# Patient Record
Sex: Female | Born: 1965 | Hispanic: Yes | Marital: Married | Smoking: Never smoker
Health system: Southern US, Community
[De-identification: ages and names within clinical notes are randomized; demographics above are authoritative.]

## PROBLEM LIST (undated history)

## (undated) DIAGNOSIS — I10 Essential (primary) hypertension: Secondary | ICD-10-CM

---

## 2006-07-14 ENCOUNTER — Inpatient Hospital Stay: Payer: Self-pay | Admitting: General Surgery

## 2006-08-03 ENCOUNTER — Emergency Department: Payer: Self-pay | Admitting: Emergency Medicine

## 2007-03-21 ENCOUNTER — Ambulatory Visit: Payer: Self-pay | Admitting: Internal Medicine

## 2007-05-02 ENCOUNTER — Ambulatory Visit: Payer: Self-pay | Admitting: Internal Medicine

## 2007-05-18 ENCOUNTER — Ambulatory Visit: Payer: Self-pay

## 2007-05-25 ENCOUNTER — Ambulatory Visit: Payer: Self-pay | Admitting: Gastroenterology

## 2008-01-08 ENCOUNTER — Ambulatory Visit: Payer: Self-pay | Admitting: Internal Medicine

## 2008-01-17 ENCOUNTER — Ambulatory Visit: Payer: Self-pay | Admitting: Otolaryngology

## 2008-01-17 ENCOUNTER — Ambulatory Visit: Payer: Self-pay | Admitting: Internal Medicine

## 2008-01-24 ENCOUNTER — Ambulatory Visit: Payer: Self-pay | Admitting: Otolaryngology

## 2008-03-03 ENCOUNTER — Ambulatory Visit: Payer: Self-pay | Admitting: Gastroenterology

## 2008-04-16 ENCOUNTER — Ambulatory Visit: Payer: Self-pay | Admitting: Surgery

## 2008-04-22 ENCOUNTER — Ambulatory Visit: Payer: Self-pay | Admitting: Surgery

## 2009-01-09 ENCOUNTER — Ambulatory Visit: Payer: Self-pay | Admitting: Gastroenterology

## 2011-02-21 ENCOUNTER — Other Ambulatory Visit: Payer: Self-pay | Admitting: Gastroenterology

## 2011-02-21 LAB — CLOSTRIDIUM DIFFICILE BY PCR

## 2011-02-23 LAB — STOOL CULTURE

## 2011-03-16 ENCOUNTER — Ambulatory Visit: Payer: Self-pay

## 2011-04-12 ENCOUNTER — Ambulatory Visit: Payer: Self-pay

## 2014-05-23 ENCOUNTER — Ambulatory Visit
Admit: 2014-05-23 | Disposition: A | Discharge status: Home / Self Care | Payer: Self-pay | Attending: Cardiology | Admitting: Cardiology

## 2014-05-23 LAB — HCG, QUANTITATIVE, PREGNANCY: Beta Hcg, Quant.: <1 m[IU]/mL

## 2016-03-10 ENCOUNTER — Other Ambulatory Visit: Payer: Self-pay | Admitting: Pediatrics

## 2016-03-10 DIAGNOSIS — Z1239 Encounter for other screening for malignant neoplasm of breast: Secondary | ICD-10-CM

## 2019-10-04 ENCOUNTER — Ambulatory Visit: Payer: BC Managed Care – PPO | Admitting: Physical Therapy

## 2019-10-11 ENCOUNTER — Encounter: Payer: BC Managed Care – PPO | Admitting: Physical Therapy

## 2019-10-18 ENCOUNTER — Ambulatory Visit: Payer: BC Managed Care – PPO | Admitting: Physical Therapy

## 2019-10-22 ENCOUNTER — Ambulatory Visit: Payer: BC Managed Care – PPO | Attending: Gastroenterology | Admitting: Physical Therapy

## 2019-10-24 ENCOUNTER — Encounter: Payer: BC Managed Care – PPO | Admitting: Physical Therapy

## 2019-10-25 ENCOUNTER — Ambulatory Visit: Payer: BC Managed Care – PPO | Admitting: Physical Therapy

## 2019-10-28 ENCOUNTER — Ambulatory Visit: Payer: BC Managed Care – PPO | Admitting: Physical Therapy

## 2019-11-01 ENCOUNTER — Ambulatory Visit: Payer: BC Managed Care – PPO | Admitting: Physical Therapy

## 2019-11-04 ENCOUNTER — Encounter: Payer: BC Managed Care – PPO | Admitting: Physical Therapy

## 2019-11-08 ENCOUNTER — Ambulatory Visit: Payer: BC Managed Care – PPO | Admitting: Physical Therapy

## 2019-11-11 ENCOUNTER — Encounter: Payer: BC Managed Care – PPO | Admitting: Physical Therapy

## 2019-11-18 ENCOUNTER — Encounter: Payer: BC Managed Care – PPO | Admitting: Physical Therapy

## 2019-11-22 ENCOUNTER — Ambulatory Visit: Payer: BC Managed Care – PPO | Admitting: Physical Therapy

## 2019-11-25 ENCOUNTER — Encounter: Payer: BC Managed Care – PPO | Admitting: Physical Therapy

## 2019-11-29 ENCOUNTER — Ambulatory Visit: Payer: BC Managed Care – PPO | Admitting: Physical Therapy

## 2019-12-02 ENCOUNTER — Encounter: Payer: BC Managed Care – PPO | Admitting: Physical Therapy

## 2019-12-06 ENCOUNTER — Ambulatory Visit: Payer: BC Managed Care – PPO | Admitting: Physical Therapy

## 2019-12-09 ENCOUNTER — Encounter: Payer: BC Managed Care – PPO | Admitting: Physical Therapy

## 2019-12-16 ENCOUNTER — Encounter: Payer: BC Managed Care – PPO | Admitting: Physical Therapy

## 2019-12-23 ENCOUNTER — Encounter: Payer: BC Managed Care – PPO | Admitting: Physical Therapy

## 2019-12-30 ENCOUNTER — Encounter: Payer: BC Managed Care – PPO | Admitting: Physical Therapy

## 2021-01-20 ENCOUNTER — Emergency Department: Payer: BC Managed Care – PPO

## 2021-01-20 ENCOUNTER — Other Ambulatory Visit: Payer: Self-pay

## 2021-01-20 ENCOUNTER — Encounter: Payer: Self-pay | Admitting: *Deleted

## 2021-01-20 ENCOUNTER — Emergency Department
Admission: EM | Admit: 2021-01-20 | Discharge: 2021-01-20 | Disposition: A | Discharge status: Home / Self Care | Payer: BC Managed Care – PPO | Attending: Emergency Medicine | Admitting: Emergency Medicine

## 2021-01-20 DIAGNOSIS — L03114 Cellulitis of left upper limb: Secondary | ICD-10-CM | POA: Diagnosis not present

## 2021-01-20 DIAGNOSIS — M79632 Pain in left forearm: Secondary | ICD-10-CM | POA: Diagnosis not present

## 2021-01-20 DIAGNOSIS — M25532 Pain in left wrist: Secondary | ICD-10-CM | POA: Diagnosis present

## 2021-01-20 MED ORDER — TRAMADOL HCL 50 MG PO TABS: 50.0000 mg | ORAL_TABLET | Freq: Four times a day (QID) | ORAL | 0 refills | Status: AC | PRN

## 2021-01-20 MED ORDER — SULFAMETHOXAZOLE-TRIMETHOPRIM 800-160 MG PO TABS: 1.0000 | ORAL_TABLET | Freq: Two times a day (BID) | ORAL | 0 refills | Status: AC

## 2021-01-20 MED ORDER — TRAMADOL HCL 50 MG PO TABS
50.0000 mg | ORAL_TABLET | Freq: Once | ORAL | Status: AC
  Administered 2021-01-20: 23:00:00 50 mg via ORAL
  Filled 2021-01-20: qty 1

## 2021-01-20 MED ORDER — SULFAMETHOXAZOLE-TRIMETHOPRIM 800-160 MG PO TABS
1.0000 | ORAL_TABLET | Freq: Once | ORAL | Status: AC
  Administered 2021-01-20: 23:00:00 1 via ORAL
  Filled 2021-01-20: qty 1

## 2021-01-20 NOTE — ED Provider Notes (Signed)
[facility] Emergency Department Provider Note ____________________________________________  Time seen: Approximately 8:37 PM  I have reviewed the triage vital signs and the nursing notes.   HISTORY  Chief Complaint Wrist Pain    HPI Michelle Valdez is a 55 y.o. female who presents to the emergency department for evaluation and treatment of wrist pain. No injury. She had some swelling and pain last week that was responsive to ibuprofen. Symptoms returned 3 days ago and has not.   No past medical history on file.  There are no problems to display for this patient.   Prior to Admission medications   Medication Sig Start Date End Date Taking? Authorizing Provider  sulfamethoxazole-trimethoprim (BACTRIM DS) 800-160 MG tablet Take 1 tablet by mouth 2 (two) times daily. 01/20/21  Yes Robet Crutchfield B, FNP  traMADol (ULTRAM) 50 MG tablet Take 1 tablet (50 mg total) by mouth every 6 (six) hours as needed. 01/20/21  Yes Versie Soave, Kasandra Knudsen, FNP    Allergies Patient has no known allergies.  No family history on file.  Social History Social History   Tobacco Use   Smoking status: Never   Smokeless tobacco: Never  Substance Use Topics   Alcohol use: Not Currently    Review of Systems Constitutional: Negative for fever. Cardiovascular: Negative for chest pain. Respiratory: Negative for shortness of breath. Musculoskeletal: Positive for left wrist pain Skin: Positive for erythema and edema of the left wrist  Neurological: Negative for decrease in sensation  ____________________________________________   PHYSICAL EXAM:  VITAL SIGNS: ED Triage Vitals  Enc Vitals Group     BP 01/20/21 1916 (!) 183/85     Pulse Rate 01/20/21 1916 87     Resp 01/20/21 1916 18     Temp 01/20/21 1916 98.4 F (36.9 C)     Temp Source 01/20/21 1916 Oral     SpO2 01/20/21 1916 97 %     Weight 01/20/21 1914 165 lb (74.8 kg)     Height 01/20/21 1914 5' (1.524 m)     Head  Circumference --      Peak Flow --      Pain Score 01/20/21 1913 10     Pain Loc --      Pain Edu? --      Excl. in GC? --     Constitutional: Alert and oriented. Well appearing and in no acute distress. Eyes: Conjunctivae are clear without discharge or drainage Head: Atraumatic Neck: Supple Respiratory: No cough. Respirations are even and unlabored. Musculoskeletal: Edematous, slightly erythematous area overlying left wrist. Neurologic: Motor and sensory function intact  Skin: mild erythema of dorsal aspect of left wrist without lymphangitis or focal fluid collection. Psychiatric: Affect and behavior are appropriate.  ____________________________________________   LABS (all labs ordered are listed, but only abnormal results are displayed)  Labs Reviewed - No data to display ____________________________________________  RADIOLOGY  X-ray of the left wrist is negative for acute bony abnormality.  Ultrasound image of the left upper extremity is negative for DVT.  I, Kem Boroughs, personally viewed and evaluated these images (plain radiographs) as part of my medical decision making, as well as reviewing the written report by the radiologist.  DG Wrist Complete Left  Result Date: 01/20/2021 CLINICAL DATA:  Pain and swelling EXAM: LEFT WRIST - COMPLETE 3+ VIEW COMPARISON:  None. FINDINGS: No fracture or malalignment. Positive for soft tissue swelling. No periostitis or bony destructive change. Mild degenerative change at the distal radioulnar joint. IMPRESSION: Soft tissue  swelling without acute osseous abnormality Electronically Signed   By: Jasmine Pang M.D.   On: 01/20/2021 21:00   US Venous Img Upper Uni Left  Result Date: 01/20/2021 CLINICAL DATA:  Pain and swelling to the dorsal wrist and forearm. EXAM: Left UPPER EXTREMITY VENOUS DOPPLER ULTRASOUND TECHNIQUE: Gray-scale sonography with graded compression, as well as color Doppler and duplex ultrasound were performed to  evaluate the upper extremity deep venous system from the level of the subclavian vein and including the jugular, axillary, basilic, radial, ulnar and upper cephalic vein. Spectral Doppler was utilized to evaluate flow at rest and with distal augmentation maneuvers. COMPARISON:  None. FINDINGS: Contralateral Subclavian Vein:  Not imaged. Internal Jugular Vein: No evidence of thrombus. Normal compressibility, respiratory phasicity and response to augmentation. Subclavian Vein: No evidence of thrombus. Normal compressibility, respiratory phasicity and response to augmentation. Axillary Vein: No evidence of thrombus. Normal compressibility, respiratory phasicity and response to augmentation. Cephalic Vein: No evidence of thrombus. Normal compressibility, respiratory phasicity and response to augmentation. Basilic Vein: No evidence of thrombus. Normal compressibility, respiratory phasicity and response to augmentation. Brachial Veins: No evidence of thrombus. Normal compressibility, respiratory phasicity and response to augmentation. Radial Veins: No evidence of thrombus. Normal compressibility, respiratory phasicity and response to augmentation. Ulnar Veins: No evidence of thrombus. Normal compressibility, respiratory phasicity and response to augmentation. Venous Reflux:  None visualized. Other Findings: Images obtained in the area of swelling and pain in the dorsal wrist area demonstrate no loculated collections or focal abnormalities. IMPRESSION: No evidence of DVT within the left upper extremity. Electronically Signed   By: Burman Nieves M.D.   On: 01/20/2021 21:53   ____________________________________________   PROCEDURES  Procedures  ____________________________________________   INITIAL IMPRESSION / ASSESSMENT AND PLAN / ED COURSE  Michelle Valdez is a 55 y.o. who presents to the emergency department for treatment and evaluation of left non-traumatic left wrist pain. See HPI  Plan will be to get  x-ray and if negative, order [place]. Patient and family aware and agree to the plan.  X-ray is negative for acute bony abnormality. [place] ordered.  X-ray and ultrasound are negative.  Because she does have some mild swelling and erythema along with pain, plan will be to treat her for an early cellulitis.  BUN and creatinine from labs drawn earlier in December were normal.  She was prescribed Bactrim.  She was encouraged to follow-up with her primary care provider if symptoms or not improving over the next 2 to 3 days.  If she is unable to be evaluated at primary care, she is to return to the emergency department.   Medications  sulfamethoxazole-trimethoprim (BACTRIM DS) 800-160 MG per tablet 1 tablet (has no administration in time range)  traMADol (ULTRAM) tablet 50 mg (has no administration in time range)    Pertinent labs & imaging results that were available during my care of the patient were reviewed by me and considered in my medical decision making (see chart for details).   _________________________________________   FINAL CLINICAL IMPRESSION(S) / ED DIAGNOSES  Final diagnoses:  Cellulitis of left upper extremity    ED Discharge Orders          Ordered    sulfamethoxazole-trimethoprim (BACTRIM DS) 800-160 MG tablet  2 times daily        01/20/21 2220    traMADol (ULTRAM) 50 MG tablet  Every 6 hours PRN        01/20/21 2220  If controlled substance prescribed during this visit, 12 month history viewed on the [facility] prior to issuing an initial prescription for Schedule II or III opiod.    Chinita Pester, FNP 01/20/21 2224    Merwyn Katos, MD 01/21/21 1515

## 2021-01-20 NOTE — ED Triage Notes (Signed)
Pt has pain in left wrist.  No known injury.  Pt wearing a brace.  Swelling noted.  Pt alert.

## 2023-07-19 IMAGING — US US EXTREM  UP VENOUS*L*
1 series · 13 of 24 positions shown · non-contrast
Comparison: None.

CLINICAL DATA: Pain and swelling to the dorsal wrist and forearm.



[Series 1: us venous img upper uni left (dvt) · portal-venous · 13 of 39 slices shown]
[im 1/39]
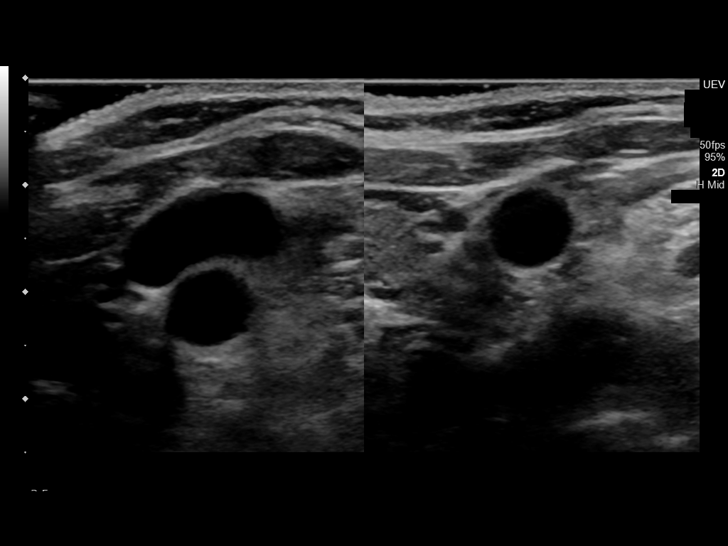
[im 4/39]
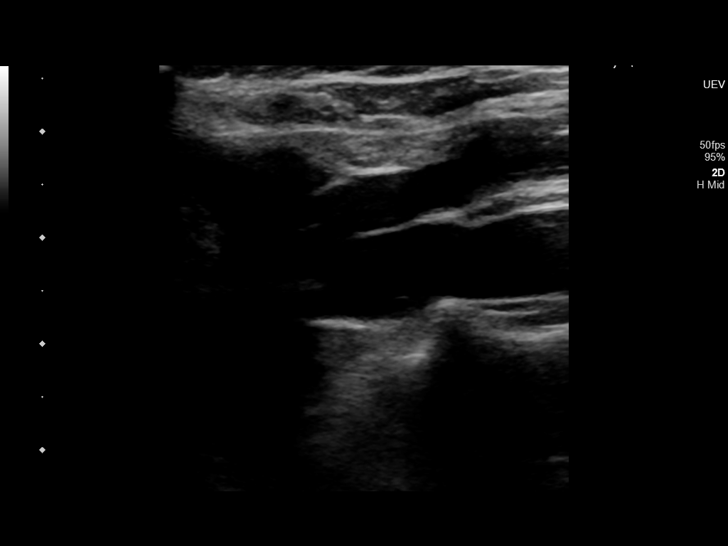
[im 7/39]
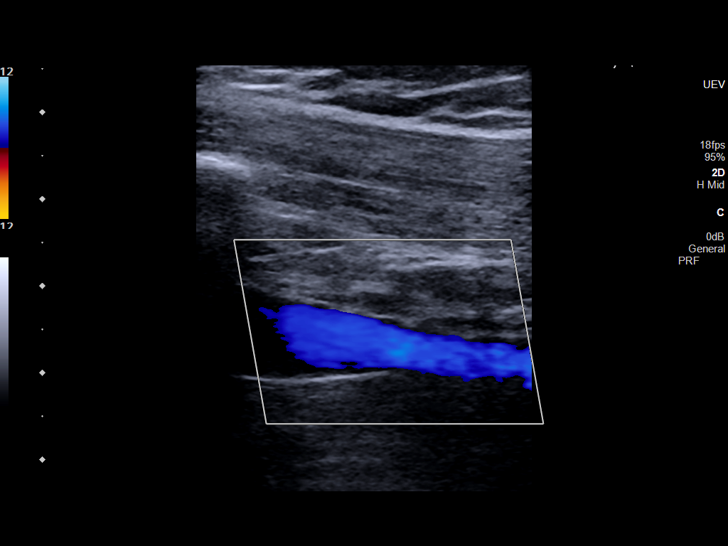
[im 10/39]
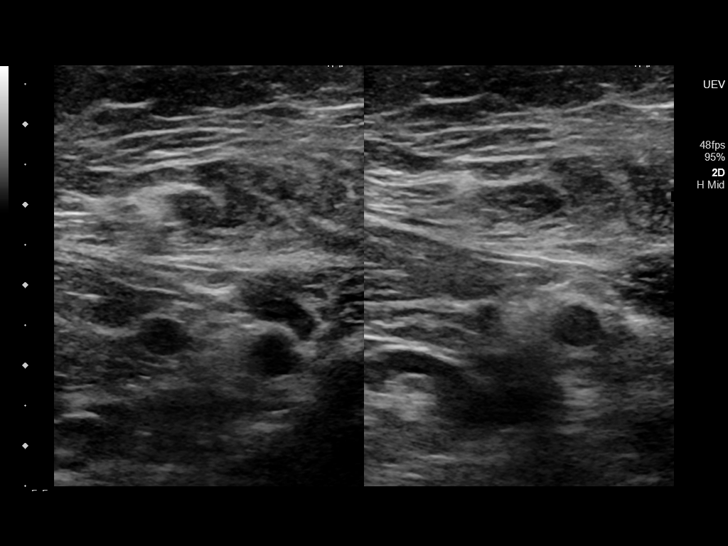
[im 14/39]
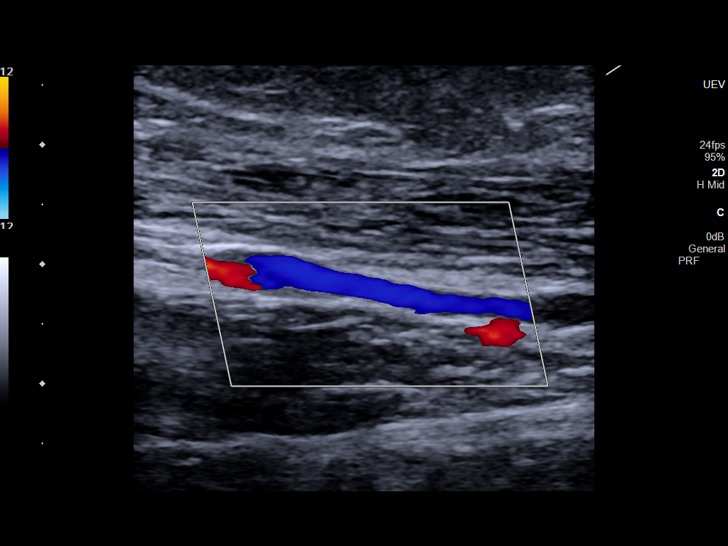
[im 17/39]
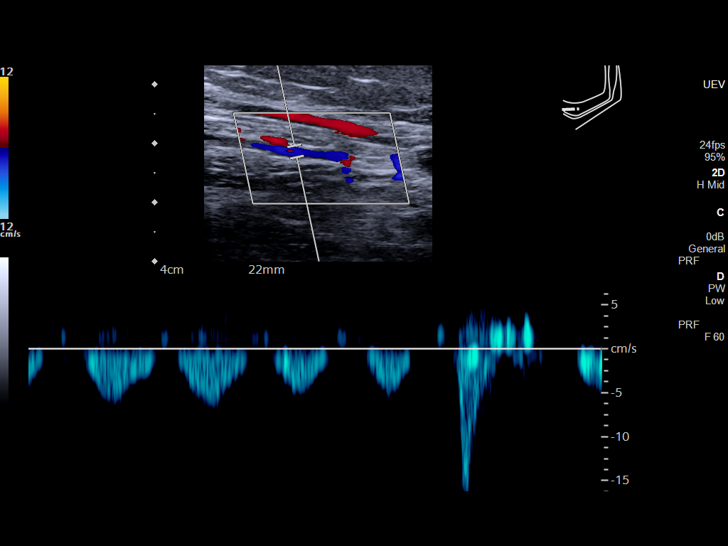
[im 20/39]
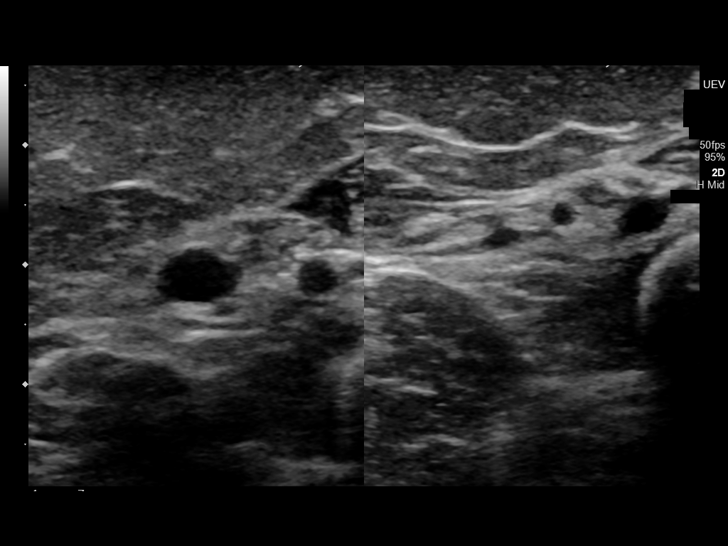
[im 22/39]
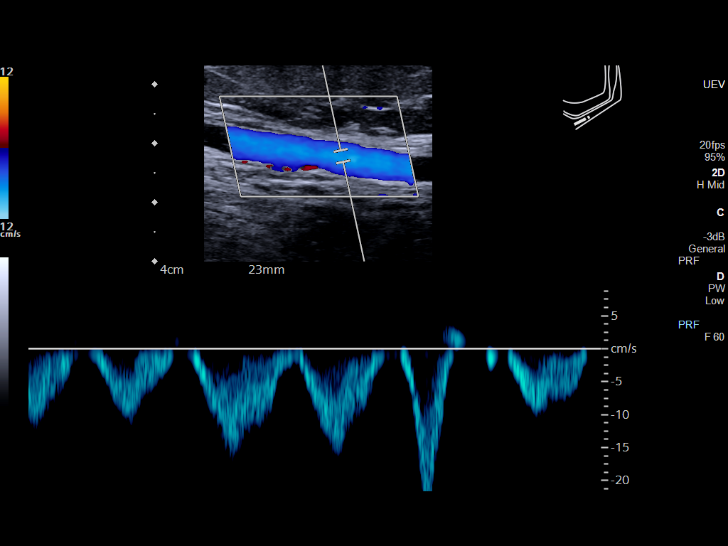
[im 25/39]
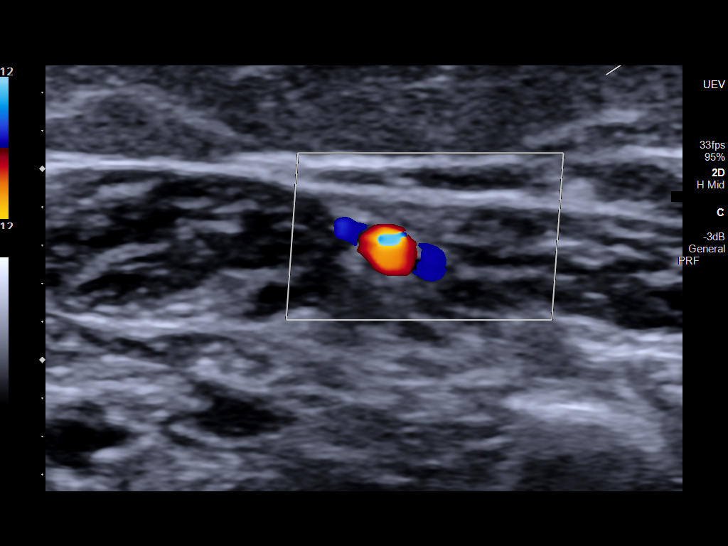
[im 29/39]
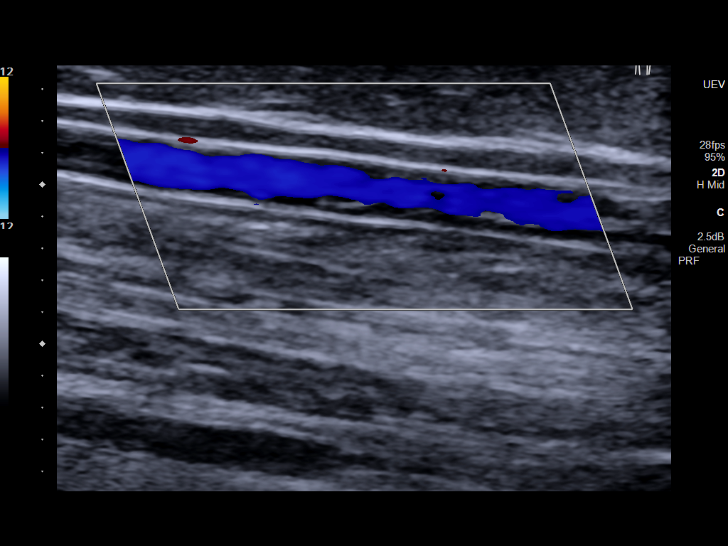
[im 32/39]
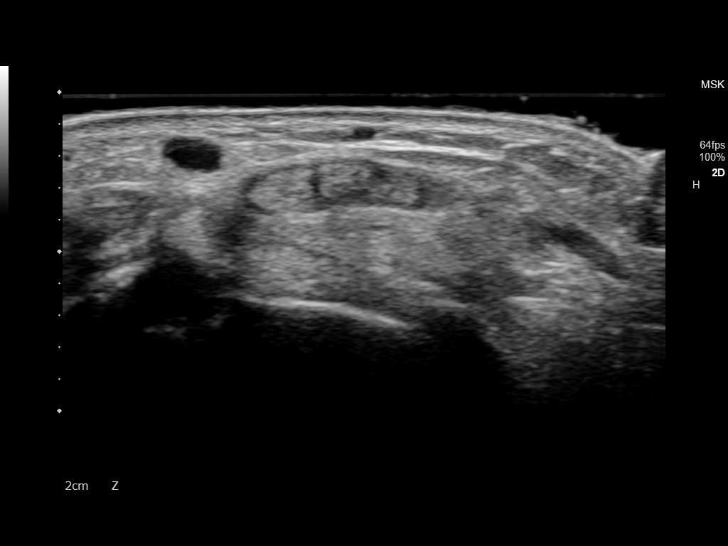
[im 35/39]
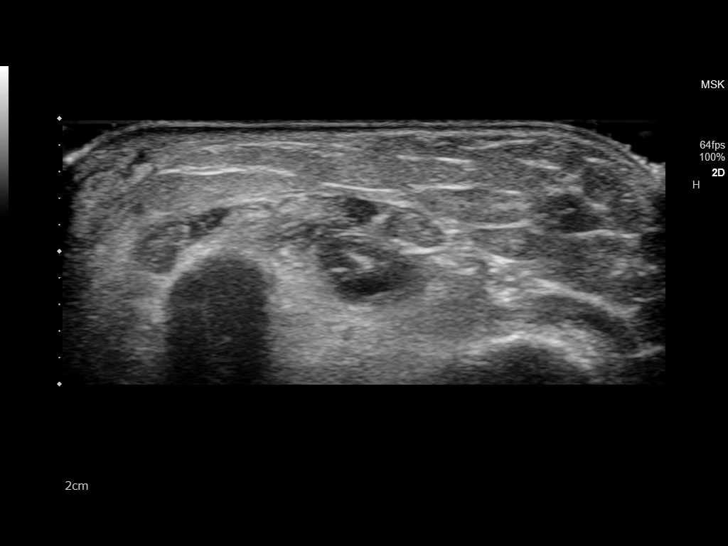
[im 39/39]
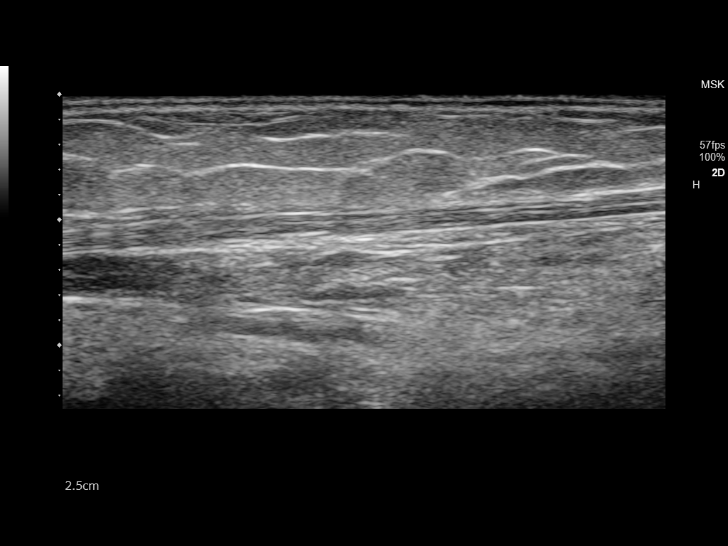

[13 of 24 positions shown; findings below may reference images not displayed]

FINDINGS: Contralateral Subclavian Vein:  Not imaged.

Internal Jugular Vein: No evidence of thrombus. Normal
compressibility, respiratory phasicity and response to augmentation.

Subclavian Vein: No evidence of thrombus. Normal compressibility,
respiratory phasicity and response to augmentation.

Axillary Vein: No evidence of thrombus. Normal compressibility,
respiratory phasicity and response to augmentation.

Cephalic Vein: No evidence of thrombus. Normal compressibility,
respiratory phasicity and response to augmentation.

Basilic Vein: No evidence of thrombus. Normal compressibility,
respiratory phasicity and response to augmentation.

Brachial Veins: No evidence of thrombus. Normal compressibility,
respiratory phasicity and response to augmentation.

Radial Veins: No evidence of thrombus. Normal compressibility,
respiratory phasicity and response to augmentation.

Ulnar Veins: No evidence of thrombus. Normal compressibility,
respiratory phasicity and response to augmentation.

Venous Reflux:  None visualized.

Other Findings: Images obtained in the area of swelling and pain in
the dorsal wrist area demonstrate no loculated collections or focal
abnormalities.
IMPRESSION: No evidence of DVT within the left upper extremity.

## 2023-07-19 IMAGING — DX DG WRIST COMPLETE 3+V*L*
4 series · 4 of 4 positions shown · non-contrast
Comparison: None.

CLINICAL DATA: Pain and swelling

EXAM:
LEFT WRIST - COMPLETE 3+ VIEW

[wrist ap (1 of 2)]
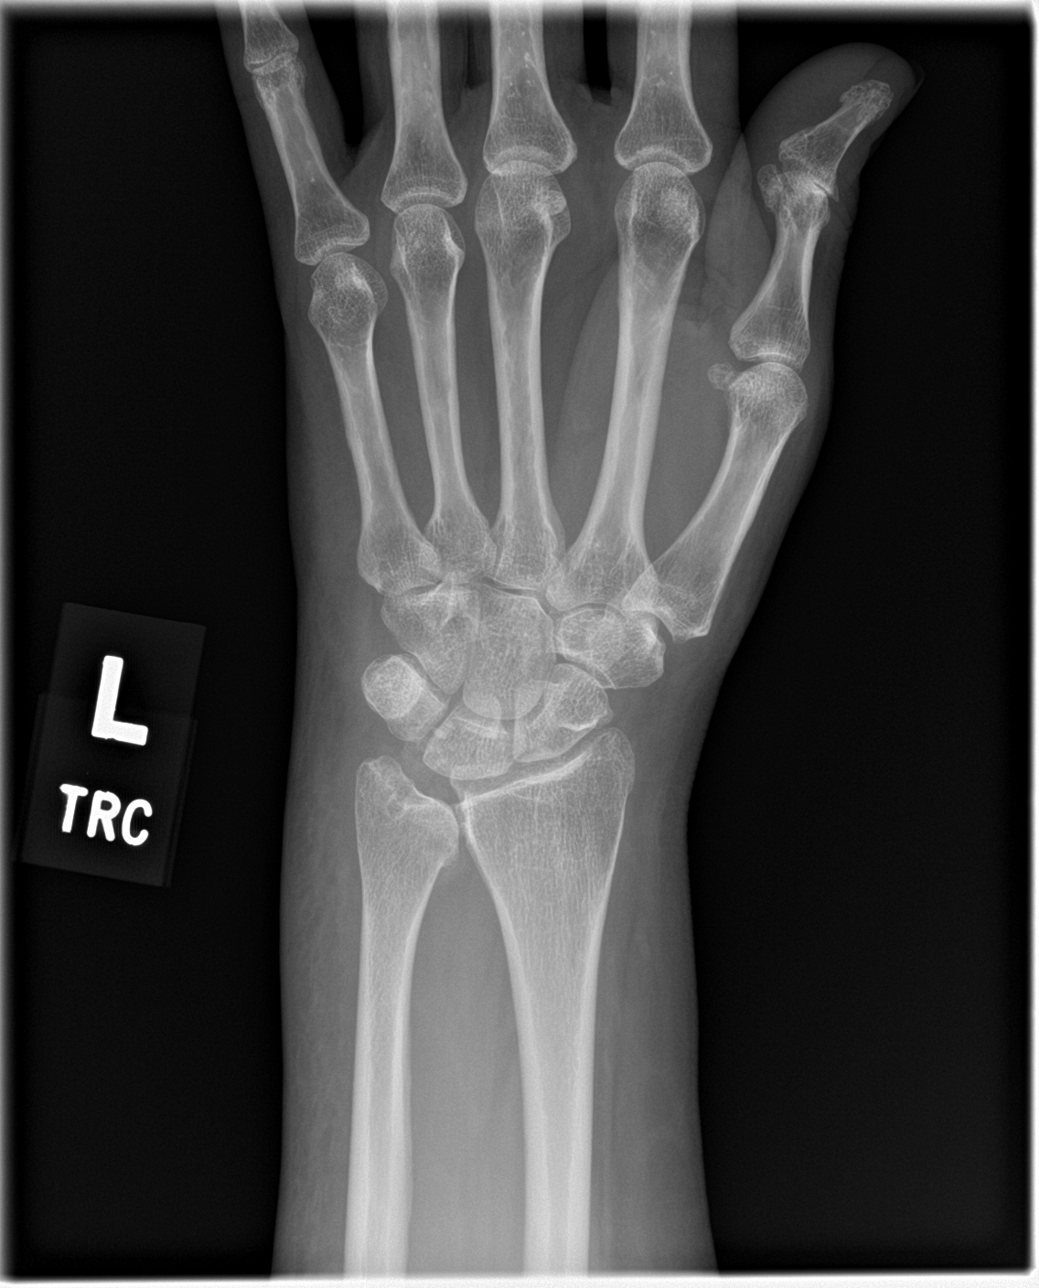

[wrist obl]
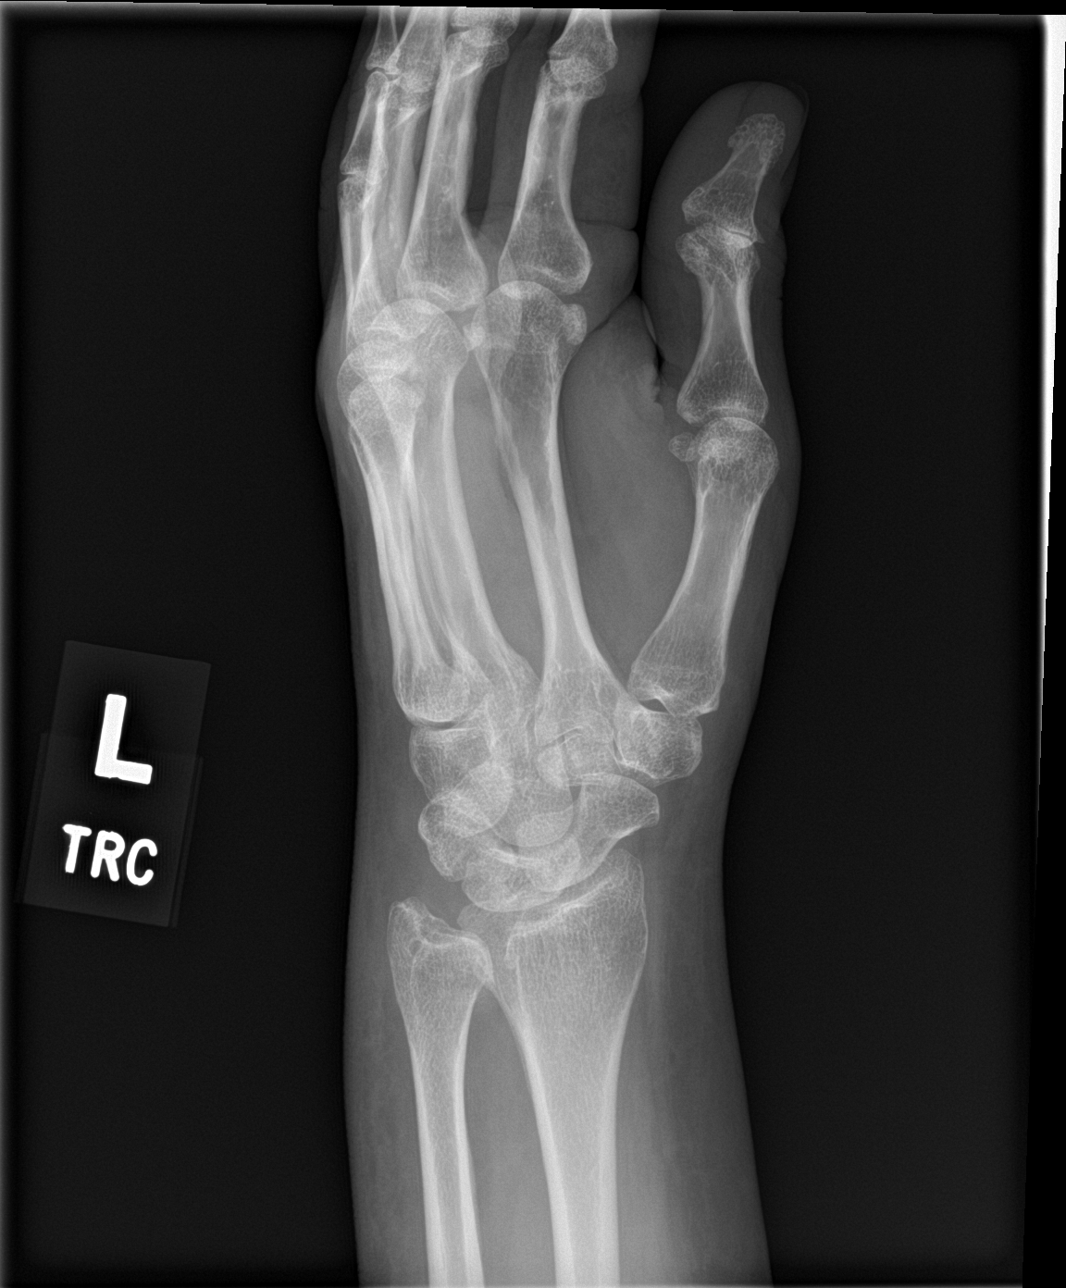

[wrist lat]
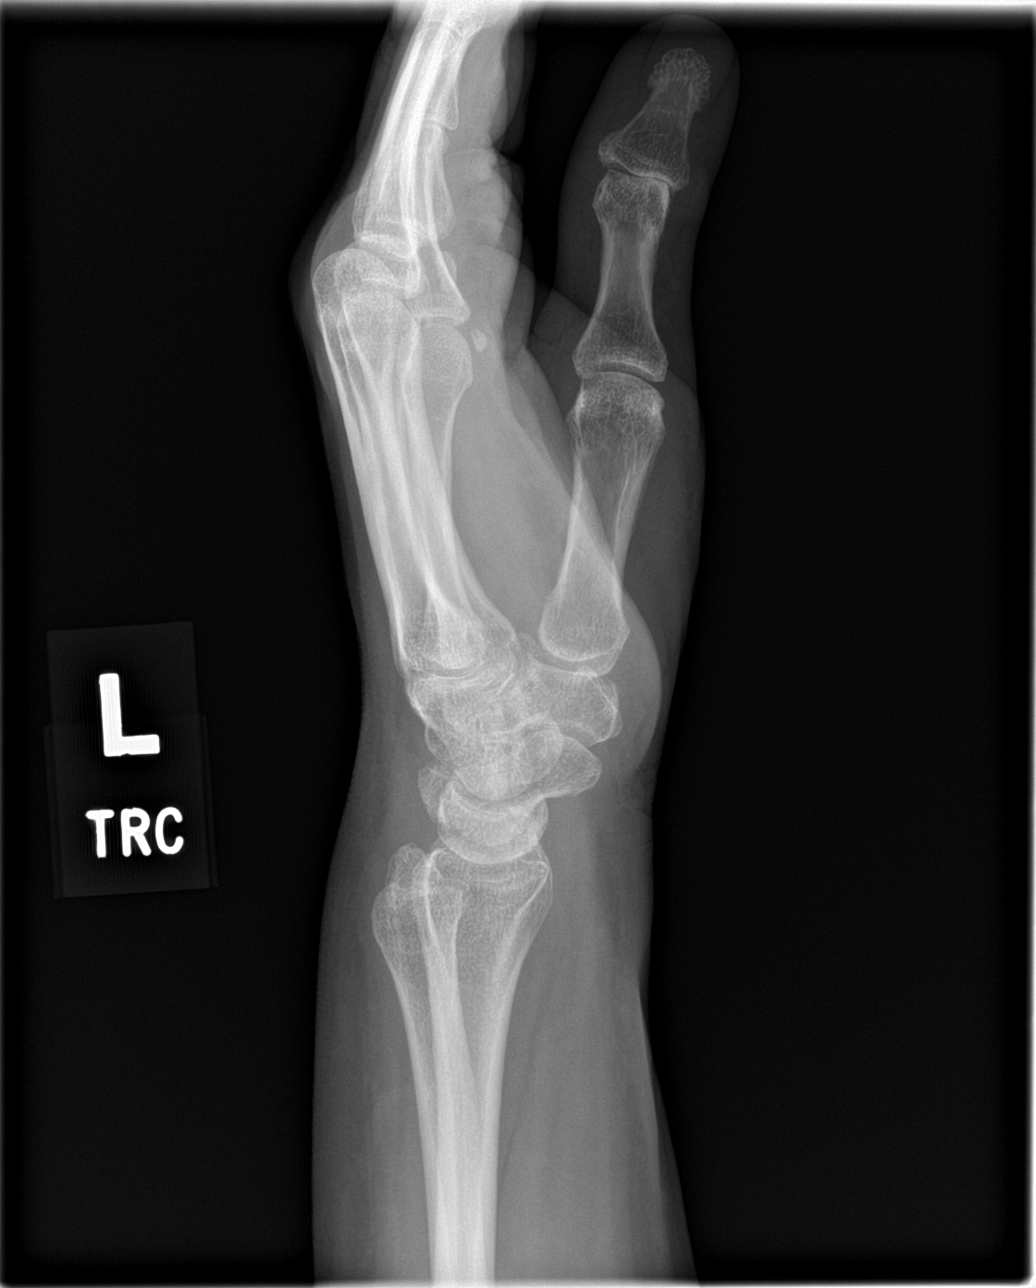

[wrist ap (2 of 2)]
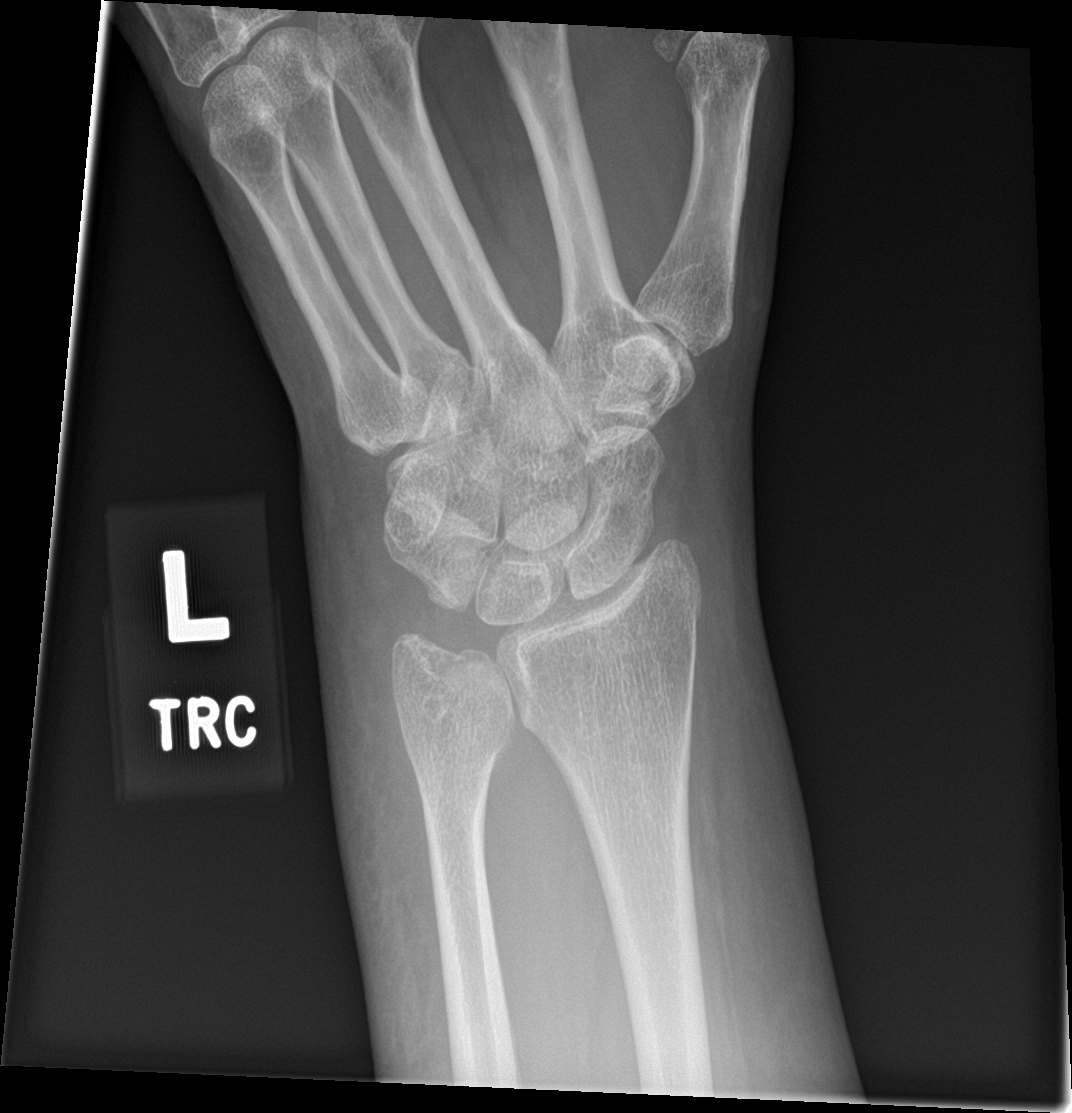

[4 of 4 positions shown; findings below may reference images not displayed]

FINDINGS: No fracture or malalignment. Positive for soft tissue swelling. No
periostitis or bony destructive change. Mild degenerative change at
the distal radioulnar joint.
IMPRESSION: Soft tissue swelling without acute osseous abnormality

## 2024-01-28 ENCOUNTER — Emergency Department

## 2024-01-28 ENCOUNTER — Emergency Department
Admission: EM | Admit: 2024-01-28 | Discharge: 2024-01-28 | Disposition: A | Discharge status: Home / Self Care | Attending: Emergency Medicine | Admitting: Emergency Medicine

## 2024-01-28 ENCOUNTER — Other Ambulatory Visit: Payer: Self-pay

## 2024-01-28 DIAGNOSIS — S42251A Displaced fracture of greater tuberosity of right humerus, initial encounter for closed fracture: Secondary | ICD-10-CM | POA: Diagnosis not present

## 2024-01-28 DIAGNOSIS — I10 Essential (primary) hypertension: Secondary | ICD-10-CM | POA: Insufficient documentation

## 2024-01-28 DIAGNOSIS — W109XXA Fall (on) (from) unspecified stairs and steps, initial encounter: Secondary | ICD-10-CM | POA: Diagnosis not present

## 2024-01-28 DIAGNOSIS — Z96641 Presence of right artificial hip joint: Secondary | ICD-10-CM | POA: Insufficient documentation

## 2024-01-28 DIAGNOSIS — S4991XA Unspecified injury of right shoulder and upper arm, initial encounter: Secondary | ICD-10-CM | POA: Diagnosis present

## 2024-01-28 DIAGNOSIS — W19XXXA Unspecified fall, initial encounter: Secondary | ICD-10-CM

## 2024-01-28 HISTORY — DX: Essential (primary) hypertension: I10

## 2024-01-28 MED ORDER — LIDOCAINE 5 % EX PTCH
1.0000 | MEDICATED_PATCH | CUTANEOUS | Status: DC
  Administered 2024-01-28: 1 via TRANSDERMAL
  Filled 2024-01-28: qty 1

## 2024-01-28 MED ORDER — ACETAMINOPHEN 500 MG PO TABS
1000.0000 mg | ORAL_TABLET | Freq: Once | ORAL | Status: AC
  Administered 2024-01-28: 1000 mg via ORAL
  Filled 2024-01-28: qty 2

## 2024-01-28 MED ORDER — OXYCODONE HCL 5 MG PO TABS
5.0000 mg | ORAL_TABLET | Freq: Once | ORAL | Status: DC
  Filled 2024-01-28: qty 1

## 2024-01-28 NOTE — ED Provider Notes (Signed)
 "   [facility] Emergency Department Provider Note     Event Date/Time   First MD Initiated Contact with Patient 01/28/24 1517     (approximate)   History   Fall   HPI  Michelle Valdez is a 59 y.o. female with a PMHx of HTN presents to the ED following a mechanical fall while visiting in the hospital on the L&D floor. Patient reports she slipped and fell onto the floor making impact directly to her right shoulder and right sided forehead.  Patient denies LOC.  Denies anticoagulation use.  Patient also complaining of right hip pain.  She reports she is s/p right hip replacement x 6 months.  Patient is able to bear weight and ambulate normal.      Physical Exam   Triage Vital Signs: ED Triage Vitals  Encounter Vitals Group     BP 01/28/24 1342 (!) 159/74     Girls Systolic BP Percentile --      Girls Diastolic BP Percentile --      Boys Systolic BP Percentile --      Boys Diastolic BP Percentile --      Pulse Rate 01/28/24 1342 85     Resp 01/28/24 1342 18     Temp 01/28/24 1342 98.1 F (36.7 C)     Temp src --      SpO2 01/28/24 1342 100 %     Weight 01/28/24 1340 168 lb (76.2 kg)     Height 01/28/24 1340 5' (1.524 m)     Head Circumference --      Peak Flow --      Pain Score 01/28/24 1340 7     Pain Loc --      Pain Education --      Exclude from Growth Chart --     Most recent vital signs: Vitals:   01/28/24 1342  BP: (!) 159/74  Pulse: 85  Resp: 18  Temp: 98.1 F (36.7 C)  SpO2: 100%    General: Well appearing and comfortable. Alert and oriented. INAD.    Head:  NCAT.  Eyes:  PERRLA. EOMI.  Neck:   No cervical spine tenderness to palpation.  CV:  Good peripheral perfusion. RRR. No peripheral edema.  RESP:  Normal effort. LCTAB. No retractions.   MSK:   Limited range of motion to right shoulder joint. No ski color changes to right shoulder. Tenderness to palpation right over lateral aspect of humeral head   NEURO: Cranial  nerves II-XII intact. No focal deficits. Speech clear. Sensation and motor function intact. Normal muscle strength of UE & LE.    ED Results / Procedures / Treatments   Labs (all labs ordered are listed, but only abnormal results are displayed) Labs Reviewed - No data to display  RADIOLOGY  I personally viewed and evaluated these images as part of my medical decision making, as well as reviewing the written report by the radiologist.  ED Provider Interpretation: acute right fracture of the greater tuberosity   Normal-appearing right hip x-ray.  Hardware appears to be still in place.  Head CT appears normal  DG Shoulder Right Result Date: 01/28/2024 EXAM: 1 VIEW(S) XRAY OF THE RIGHT SHOULDER 01/28/2024 02:08:57 PM COMPARISON: None available. CLINICAL HISTORY: fall FINDINGS: BONES AND JOINTS: Glenohumeral joint is normally aligned. Acute minimally displaced fracture of the greater tuberosity of the humerus. No malalignment. The [facility] joint is unremarkable. SOFT TISSUES: No abnormal calcifications. Visualized lung is unremarkable. IMPRESSION: 1.  Acute minimally displaced fracture of the greater tuberosity of the right humerus. Electronically signed by: Norman Gatlin MD 01/28/2024 02:15 PM EST RP Workstation: [id number]   CT Head Wo Contrast Result Date: 01/28/2024 EXAM: CT HEAD WITHOUT 01/28/2024 02:09:22 PM TECHNIQUE: CT of the head was performed without the administration of intravenous contrast. Automated exposure control, iterative reconstruction, and/or weight based adjustment of the mA/kV was utilized to reduce the radiation dose to as low as reasonably achievable. COMPARISON: Comparison with CT head 03/21/2007. CLINICAL HISTORY: fall FINDINGS: BRAIN AND VENTRICLES: No acute intracranial hemorrhage. No mass effect or midline shift. No extra-axial fluid collection. No evidence of acute infarct. No hydrocephalus. ORBITS: No acute abnormality. SINUSES AND MASTOIDS: Mucosal thickening in the ethmoid  air cells and sphenoid sinuses. No acute abnormality of the mastoids. SOFT TISSUES AND SKULL: No acute skull fracture. No acute soft tissue abnormality. IMPRESSION: 1. No acute intracranial abnormality. Electronically signed by: Norman Gatlin MD 01/28/2024 02:14 PM EST RP Workstation: [id number]   DG Hip Unilat W or Wo Pelvis 2-3 Views Right Result Date: 01/28/2024 EXAM: 2 or more VIEW(S) XRAY OF THE PELVIS AND RIGHT HIP 01/28/2024 02:08:57 PM COMPARISON: None available. CLINICAL HISTORY: fall FINDINGS: BONES AND JOINTS: SI joints are symmetric. No acute fracture. 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 Moderate degenerative changes in the left hip with joint space narrowing and osteophyte formation. SOFT TISSUES: The soft tissues are unremarkable. IMPRESSION: 1. No acute fracture or dislocation. 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 Electronically signed by: Norman Gatlin MD 01/28/2024 02:12 PM EST RP Workstation: [id number]    PROCEDURES:  Critical Care performed: No  Procedures   MEDICATIONS ORDERED IN ED: Medications  lidocaine  (LIDODERM ) 5 % 1 patch (1 patch Transdermal Patch Applied 01/28/24 1555)  acetaminophen  (TYLENOL ) tablet 1,000 mg (1,000 mg Oral Given 01/28/24 1555)     IMPRESSION / MDM / ASSESSMENT AND PLAN / ED COURSE  I reviewed the triage vital signs and the nursing notes.                              Clinical Course as of 01/28/24 1603  Sun Jan 28, 2024  1518 DG Shoulder Right IMPRESSION: 1. Acute minimally displaced fracture of the greater tuberosity of the right humerus   [MH]  1518 CT Head Wo Contrast IMPRESSION: 1. No acute intracranial abnormality.   [MH]  1518 DG Hip Unilat W or Wo Pelvis 2-3 Views Right MPRESSION: 1. No acute fracture or dislocation. 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   [MH]    Clinical Course User Index [MH] Margrette Rebbeca LABOR, PA-C    59 y.o. female presents to the emergency department for evaluation and treatment of mechanical fall sustaining  right shoulder pain, right hip pain and head injury. See HPI for further details.   Differential diagnosis includes, but is not limited to fracture, dislocation, contusion, hematoma  Patient's presentation is most consistent with acute complicated illness / injury requiring diagnostic workup.  Patient is alert and oriented.  She is hemodynamic stable.  Physical exam findings are stated above and pertinent for limited range of motion in the right shoulder.  Tenderness over the lateral aspect of the proximal humeral head.  Right shoulder x-ray confirms an acute minimally displaced fracture of the greater tuberosity.  Reassuring head CT and right hip x-ray.  Patient is placed in sling.  Tylenol  was given for pain.  Patient was offered something stronger however reports she has a  high sensitivity to pain medication.  She is advised to remain in sling until she is able to follow-up with orthopedic for further evaluation.  RICE therapy education provided.  Patient stable condition for discharge home.  She is in agreement with this care plan.  ED return precaution discussed.   FINAL CLINICAL IMPRESSION(S) / ED DIAGNOSES   Final diagnoses:  Closed displaced fracture of greater tuberosity of right humerus, initial encounter  Fall, initial encounter   Rx / DC Orders   ED Discharge Orders     None        Note:  This document was prepared using Dragon voice recognition software and may include unintentional dictation errors.    Margrette, Dhalia Zingaro A, PA-C 01/28/24 1603    Jacolyn Pae, MD 01/28/24 1821  "

## 2024-01-28 NOTE — Discharge Instructions (Addendum)
 Your right shoulder x-ray shows a minimally displaced fracture of the greater tuberosity of the right shoulder.  You have been placed in a sling and encouraged to remain in the sling until you are able to follow-up with orthopedics.  Call Dr. Suzie office tomorrow.   Pain control:  Ibuprofen (motrin/aleve/advil) - You can take 3 tablets (600 mg) every 6 hours as needed for pain.  Acetaminophen  (tylenol ) - You can take 2 extra strength tablets (1000 mg) every 6 hours as needed for pain.  You can alternate these medications or take them together.  Make sure you eat food/drink water when taking these medications.  Your head CT and right hip x-ray is normal.

## 2024-01-28 NOTE — ED Triage Notes (Signed)
 Pt comes with trip and fall while visiting upstairs. Pt states pain to right arm/shoulder, right hip and her head. Pt states 7/10 pain. Pt is not on thinners.
